# Patient Record
Sex: Male | Born: 1935 | Race: White | Hispanic: No | Marital: Married
Health system: Southern US, Community
[De-identification: ages and names within clinical notes are randomized; demographics above are authoritative.]

---

## 2005-06-12 ENCOUNTER — Ambulatory Visit: Payer: Self-pay | Admitting: Internal Medicine

## 2006-06-04 ENCOUNTER — Ambulatory Visit: Payer: Self-pay | Admitting: Cardiology

## 2012-07-31 ENCOUNTER — Other Ambulatory Visit: Payer: Self-pay | Admitting: Internal Medicine

## 2012-08-01 LAB — URINALYSIS, COMPLETE
Glucose,UR: NEGATIVE mg/dL (ref 0–75)
Ketone: NEGATIVE
Nitrite: NEGATIVE
Ph: 5 (ref 4.5–8.0)
Protein: 100
Specific Gravity: 1.016 (ref 1.003–1.030)
Squamous Epithelial: <1

## 2013-01-08 ENCOUNTER — Observation Stay: Payer: Self-pay | Admitting: Internal Medicine

## 2013-01-08 LAB — DIFFERENTIAL
Basophil %: 0.3 %
Eosinophil #: 0.1 10*3/uL (ref 0.0–0.7)
Lymphocyte #: 1.7 10*3/uL (ref 1.0–3.6)
Monocyte #: 2.9 x10 3/mm — ABNORMAL HIGH (ref 0.2–1.0)
Monocyte %: 7.5 %

## 2013-01-08 LAB — COMPREHENSIVE METABOLIC PANEL
Alkaline Phosphatase: 99 U/L (ref 50–136)
Bilirubin,Total: 1.4 mg/dL — ABNORMAL HIGH (ref 0.2–1.0)
Chloride: 108 mmol/L — ABNORMAL HIGH (ref 98–107)
EGFR (Non-African Amer.): >60
Glucose: 178 mg/dL — ABNORMAL HIGH (ref 65–99)
SGPT (ALT): 17 U/L (ref 12–78)

## 2013-01-08 LAB — URINALYSIS, COMPLETE
Nitrite: NEGATIVE
Ph: 5 (ref 4.5–8.0)
Protein: 100
RBC,UR: 3 /HPF (ref 0–5)
Squamous Epithelial: <1
WBC UR: 14 /HPF (ref 0–5)

## 2013-01-08 LAB — LIPASE, BLOOD: Lipase: 89 U/L (ref 73–393)

## 2013-01-08 LAB — CBC
HCT: 42.7 % (ref 40.0–52.0)
MCH: 28.6 pg (ref 26.0–34.0)
MCHC: 34.1 g/dL (ref 32.0–36.0)
RBC: 5.09 10*6/uL (ref 4.40–5.90)

## 2013-01-09 LAB — CBC WITH DIFFERENTIAL/PLATELET
Basophil #: 0 10*3/uL (ref 0.0–0.1)
Basophil %: 0.2 %
Eosinophil #: 0 10*3/uL (ref 0.0–0.7)
Eosinophil %: 0 %
HGB: 12 g/dL — ABNORMAL LOW (ref 13.0–18.0)
Lymphocyte #: 1.9 10*3/uL (ref 1.0–3.6)
Lymphocyte %: 6.1 %
MCHC: 33.2 g/dL (ref 32.0–36.0)
MCV: 84 fL (ref 80–100)
Neutrophil #: 26.7 10*3/uL — ABNORMAL HIGH (ref 1.4–6.5)
Neutrophil %: 85.8 %
Platelet: 221 10*3/uL (ref 150–440)
RDW: 14.9 % — ABNORMAL HIGH (ref 11.5–14.5)
WBC: 31.1 10*3/uL — ABNORMAL HIGH (ref 3.8–10.6)

## 2013-01-09 LAB — COMPREHENSIVE METABOLIC PANEL
Albumin: 2.6 g/dL — ABNORMAL LOW (ref 3.4–5.0)
Alkaline Phosphatase: 77 U/L (ref 50–136)
BUN: 27 mg/dL — ABNORMAL HIGH (ref 7–18)
Bilirubin,Total: 0.9 mg/dL (ref 0.2–1.0)
Calcium, Total: 8.6 mg/dL (ref 8.5–10.1)
Co2: 27 mmol/L (ref 21–32)
EGFR (African American): 55 — ABNORMAL LOW
EGFR (Non-African Amer.): 48 — ABNORMAL LOW
Glucose: 137 mg/dL — ABNORMAL HIGH (ref 65–99)
Osmolality: 296 (ref 275–301)
Potassium: 2.8 mmol/L — ABNORMAL LOW (ref 3.5–5.1)
SGOT(AST): 21 U/L (ref 15–37)
SGPT (ALT): 14 U/L (ref 12–78)
Sodium: 145 mmol/L (ref 136–145)
Total Protein: 6 g/dL — ABNORMAL LOW (ref 6.4–8.2)

## 2013-01-09 LAB — TROPONIN I: Troponin-I: 0.05 ng/mL

## 2013-01-10 LAB — BASIC METABOLIC PANEL
Calcium, Total: 7.8 mg/dL — ABNORMAL LOW (ref 8.5–10.1)
Chloride: 106 mmol/L (ref 98–107)
Co2: 28 mmol/L (ref 21–32)
EGFR (African American): 58 — ABNORMAL LOW
EGFR (Non-African Amer.): 50 — ABNORMAL LOW
Glucose: 122 mg/dL — ABNORMAL HIGH (ref 65–99)
Osmolality: 290 (ref 275–301)
Sodium: 142 mmol/L (ref 136–145)

## 2013-01-10 LAB — CBC WITH DIFFERENTIAL/PLATELET
Basophil #: 0.1 10*3/uL (ref 0.0–0.1)
Eosinophil #: 0 10*3/uL (ref 0.0–0.7)
HGB: 11.6 g/dL — ABNORMAL LOW (ref 13.0–18.0)
MCH: 28.9 pg (ref 26.0–34.0)
MCV: 84 fL (ref 80–100)
Monocyte #: 1.1 x10 3/mm — ABNORMAL HIGH (ref 0.2–1.0)
Monocyte %: 5.4 %
Neutrophil #: 17.6 10*3/uL — ABNORMAL HIGH (ref 1.4–6.5)
Neutrophil %: 86.5 %
Platelet: 159 10*3/uL (ref 150–440)
RBC: 4 10*6/uL — ABNORMAL LOW (ref 4.40–5.90)
WBC: 20.4 10*3/uL — ABNORMAL HIGH (ref 3.8–10.6)

## 2013-01-12 LAB — URINE CULTURE

## 2013-09-17 ENCOUNTER — Emergency Department: Payer: Self-pay | Admitting: Emergency Medicine

## 2014-02-10 ENCOUNTER — Emergency Department: Payer: Self-pay | Admitting: Emergency Medicine

## 2014-02-10 LAB — CBC
HCT: 38.7 % — ABNORMAL LOW (ref 40.0–52.0)
HGB: 12.3 g/dL — ABNORMAL LOW (ref 13.0–18.0)
MCH: 29 pg (ref 26.0–34.0)
MCHC: 31.8 g/dL — ABNORMAL LOW (ref 32.0–36.0)
MCV: 91 fL (ref 80–100)
Platelet: 172 10*3/uL (ref 150–440)
RBC: 4.24 10*6/uL — ABNORMAL LOW (ref 4.40–5.90)
RDW: 14.9 % — AB (ref 11.5–14.5)
WBC: 8.2 10*3/uL (ref 3.8–10.6)

## 2014-02-10 LAB — COMPREHENSIVE METABOLIC PANEL
ALT: 14 U/L (ref 12–78)
Albumin: 3 g/dL — ABNORMAL LOW (ref 3.4–5.0)
Alkaline Phosphatase: 100 U/L
Anion Gap: 4 — ABNORMAL LOW (ref 7–16)
BUN: 23 mg/dL — ABNORMAL HIGH (ref 7–18)
Bilirubin,Total: 0.3 mg/dL (ref 0.2–1.0)
CALCIUM: 8.9 mg/dL (ref 8.5–10.1)
Chloride: 109 mmol/L — ABNORMAL HIGH (ref 98–107)
Co2: 26 mmol/L (ref 21–32)
Creatinine: 1.45 mg/dL — ABNORMAL HIGH (ref 0.60–1.30)
EGFR (African American): 50 — ABNORMAL LOW
GFR CALC NON AF AMER: 43 — AB
Glucose: 92 mg/dL (ref 65–99)
Osmolality: 281 (ref 275–301)
Potassium: 4.3 mmol/L (ref 3.5–5.1)
SGOT(AST): 23 U/L (ref 15–37)
SODIUM: 139 mmol/L (ref 136–145)
TOTAL PROTEIN: 7.1 g/dL (ref 6.4–8.2)

## 2014-02-10 LAB — LIPASE, BLOOD: Lipase: 251 U/L (ref 73–393)

## 2014-03-15 ENCOUNTER — Other Ambulatory Visit: Payer: Self-pay

## 2014-03-15 LAB — URINALYSIS, COMPLETE
Bilirubin,UR: NEGATIVE
Glucose,UR: NEGATIVE mg/dL (ref 0–75)
KETONE: NEGATIVE
NITRITE: NEGATIVE
Ph: 7 (ref 4.5–8.0)
Protein: 100
RBC,UR: 87 /HPF (ref 0–5)
SPECIFIC GRAVITY: 1.011 (ref 1.003–1.030)
WBC UR: 504 /HPF (ref 0–5)

## 2014-03-18 LAB — URINE CULTURE

## 2014-04-23 ENCOUNTER — Inpatient Hospital Stay: Payer: Self-pay | Admitting: Internal Medicine

## 2014-04-23 LAB — URINALYSIS, COMPLETE
Bilirubin,UR: NEGATIVE
GLUCOSE, UR: NEGATIVE mg/dL (ref 0–75)
Ketone: NEGATIVE
Nitrite: NEGATIVE
Ph: 5 (ref 4.5–8.0)
Protein: 100
Specific Gravity: 1.014 (ref 1.003–1.030)
WBC UR: 5416 /HPF (ref 0–5)

## 2014-04-23 LAB — COMPREHENSIVE METABOLIC PANEL
ALBUMIN: 3.2 g/dL — AB (ref 3.4–5.0)
ALK PHOS: 102 U/L
ALT: 15 U/L
ANION GAP: 10 (ref 7–16)
AST: 16 U/L (ref 15–37)
BILIRUBIN TOTAL: 0.8 mg/dL (ref 0.2–1.0)
BUN: 34 mg/dL — ABNORMAL HIGH (ref 7–18)
CREATININE: 1.74 mg/dL — AB (ref 0.60–1.30)
Calcium, Total: 8.7 mg/dL (ref 8.5–10.1)
Chloride: 111 mmol/L — ABNORMAL HIGH (ref 98–107)
Co2: 21 mmol/L (ref 21–32)
Glucose: 146 mg/dL — ABNORMAL HIGH (ref 65–99)
OSMOLALITY: 293 (ref 275–301)
Potassium: 4 mmol/L (ref 3.5–5.1)
Sodium: 142 mmol/L (ref 136–145)
TOTAL PROTEIN: 7.3 g/dL (ref 6.4–8.2)

## 2014-04-23 LAB — CBC WITH DIFFERENTIAL/PLATELET
Basophil #: 0.1 10*3/uL (ref 0.0–0.1)
Basophil %: 0.4 %
EOS PCT: 0.1 %
Eosinophil #: 0 10*3/uL (ref 0.0–0.7)
HCT: 42.1 % (ref 40.0–52.0)
HGB: 13.1 g/dL (ref 13.0–18.0)
LYMPHS ABS: 2.3 10*3/uL (ref 1.0–3.6)
Lymphocyte %: 8.9 %
MCH: 28.3 pg (ref 26.0–34.0)
MCHC: 31.1 g/dL — ABNORMAL LOW (ref 32.0–36.0)
MCV: 91 fL (ref 80–100)
MONOS PCT: 9.6 %
Monocyte #: 2.5 x10 3/mm — ABNORMAL HIGH (ref 0.2–1.0)
NEUTROS ABS: 20.7 10*3/uL — AB (ref 1.4–6.5)
NEUTROS PCT: 81 %
Platelet: 225 10*3/uL (ref 150–440)
RBC: 4.62 10*6/uL (ref 4.40–5.90)
RDW: 16 % — AB (ref 11.5–14.5)
WBC: 25.5 10*3/uL — ABNORMAL HIGH (ref 3.8–10.6)

## 2014-04-23 LAB — PROTIME-INR
INR: 1.3
PROTHROMBIN TIME: 15.5 s — AB (ref 11.5–14.7)

## 2014-04-23 LAB — PHOSPHORUS: Phosphorus: 2.6 mg/dL (ref 2.5–4.9)

## 2014-04-23 LAB — TROPONIN I

## 2014-04-23 LAB — MAGNESIUM: MAGNESIUM: 1.6 mg/dL — AB

## 2014-04-23 LAB — LIPASE, BLOOD: Lipase: 111 U/L (ref 73–393)

## 2014-04-24 LAB — BASIC METABOLIC PANEL
ANION GAP: 7 (ref 7–16)
BUN: 32 mg/dL — ABNORMAL HIGH (ref 7–18)
CALCIUM: 8.7 mg/dL (ref 8.5–10.1)
Chloride: 116 mmol/L — ABNORMAL HIGH (ref 98–107)
Co2: 24 mmol/L (ref 21–32)
Creatinine: 1.8 mg/dL — ABNORMAL HIGH (ref 0.60–1.30)
GFR CALC AF AMER: 46 — AB
GFR CALC NON AF AMER: 38 — AB
Glucose: 122 mg/dL — ABNORMAL HIGH (ref 65–99)
OSMOLALITY: 301 (ref 275–301)
POTASSIUM: 4.1 mmol/L (ref 3.5–5.1)
Sodium: 147 mmol/L — ABNORMAL HIGH (ref 136–145)

## 2014-04-24 LAB — CBC WITH DIFFERENTIAL/PLATELET
Basophil #: 0 10*3/uL (ref 0.0–0.1)
Basophil %: 0.2 %
Eosinophil #: 0 10*3/uL (ref 0.0–0.7)
Eosinophil %: 0 %
HCT: 38.3 % — ABNORMAL LOW (ref 40.0–52.0)
HGB: 12.1 g/dL — ABNORMAL LOW (ref 13.0–18.0)
LYMPHS ABS: 1.9 10*3/uL (ref 1.0–3.6)
LYMPHS PCT: 7.3 %
MCH: 28.7 pg (ref 26.0–34.0)
MCHC: 31.6 g/dL — ABNORMAL LOW (ref 32.0–36.0)
MCV: 91 fL (ref 80–100)
MONO ABS: 2.3 x10 3/mm — AB (ref 0.2–1.0)
Monocyte %: 8.9 %
NEUTROS ABS: 22 10*3/uL — AB (ref 1.4–6.5)
Neutrophil %: 83.6 %
Platelet: 196 10*3/uL (ref 150–440)
RBC: 4.21 10*6/uL — ABNORMAL LOW (ref 4.40–5.90)
RDW: 16 % — ABNORMAL HIGH (ref 11.5–14.5)
WBC: 26.3 10*3/uL — AB (ref 3.8–10.6)

## 2014-04-25 DIAGNOSIS — I369 Nonrheumatic tricuspid valve disorder, unspecified: Secondary | ICD-10-CM

## 2014-04-25 LAB — CBC WITH DIFFERENTIAL/PLATELET
Basophil #: 0.1 10*3/uL (ref 0.0–0.1)
Basophil %: 0.6 %
EOS ABS: 0.1 10*3/uL (ref 0.0–0.7)
EOS PCT: 0.6 %
HCT: 30.2 % — AB (ref 40.0–52.0)
HGB: 9.7 g/dL — ABNORMAL LOW (ref 13.0–18.0)
LYMPHS ABS: 2.4 10*3/uL (ref 1.0–3.6)
LYMPHS PCT: 17.9 %
MCH: 29.1 pg (ref 26.0–34.0)
MCHC: 32.2 g/dL (ref 32.0–36.0)
MCV: 90 fL (ref 80–100)
Monocyte #: 0.8 x10 3/mm (ref 0.2–1.0)
Monocyte %: 6 %
NEUTROS PCT: 74.9 %
Neutrophil #: 9.9 10*3/uL — ABNORMAL HIGH (ref 1.4–6.5)
Platelet: 158 10*3/uL (ref 150–440)
RBC: 3.35 10*6/uL — AB (ref 4.40–5.90)
RDW: 15.8 % — ABNORMAL HIGH (ref 11.5–14.5)
WBC: 13.2 10*3/uL — ABNORMAL HIGH (ref 3.8–10.6)

## 2014-04-25 LAB — BASIC METABOLIC PANEL
ANION GAP: 8 (ref 7–16)
BUN: 25 mg/dL — AB (ref 7–18)
CO2: 22 mmol/L (ref 21–32)
Calcium, Total: 7.9 mg/dL — ABNORMAL LOW (ref 8.5–10.1)
Chloride: 115 mmol/L — ABNORMAL HIGH (ref 98–107)
Creatinine: 1.4 mg/dL — ABNORMAL HIGH (ref 0.60–1.30)
EGFR (African American): >60
EGFR (Non-African Amer.): 51 — ABNORMAL LOW
Glucose: 90 mg/dL (ref 65–99)
OSMOLALITY: 293 (ref 275–301)
Potassium: 3.1 mmol/L — ABNORMAL LOW (ref 3.5–5.1)
Sodium: 145 mmol/L (ref 136–145)

## 2014-04-26 LAB — CBC WITH DIFFERENTIAL/PLATELET
BASOS PCT: 0.5 %
Basophil #: 0 10*3/uL (ref 0.0–0.1)
EOS ABS: 0.2 10*3/uL (ref 0.0–0.7)
Eosinophil %: 1.8 %
HCT: 31 % — AB (ref 40.0–52.0)
HGB: 10.2 g/dL — ABNORMAL LOW (ref 13.0–18.0)
LYMPHS ABS: 2.1 10*3/uL (ref 1.0–3.6)
Lymphocyte %: 24.8 %
MCH: 29.4 pg (ref 26.0–34.0)
MCHC: 33 g/dL (ref 32.0–36.0)
MCV: 89 fL (ref 80–100)
MONO ABS: 0.7 x10 3/mm (ref 0.2–1.0)
MONOS PCT: 7.9 %
NEUTROS PCT: 65 %
Neutrophil #: 5.5 10*3/uL (ref 1.4–6.5)
Platelet: 160 10*3/uL (ref 150–440)
RBC: 3.47 10*6/uL — ABNORMAL LOW (ref 4.40–5.90)
RDW: 16.1 % — ABNORMAL HIGH (ref 11.5–14.5)
WBC: 8.5 10*3/uL (ref 3.8–10.6)

## 2014-04-26 LAB — BASIC METABOLIC PANEL
Anion Gap: 8 (ref 7–16)
BUN: 18 mg/dL (ref 7–18)
Calcium, Total: 7.6 mg/dL — ABNORMAL LOW (ref 8.5–10.1)
Chloride: 113 mmol/L — ABNORMAL HIGH (ref 98–107)
Co2: 24 mmol/L (ref 21–32)
Creatinine: 1.19 mg/dL (ref 0.60–1.30)
EGFR (African American): >60
GLUCOSE: 95 mg/dL (ref 65–99)
Osmolality: 290 (ref 275–301)
Potassium: 2.9 mmol/L — ABNORMAL LOW (ref 3.5–5.1)
SODIUM: 145 mmol/L (ref 136–145)

## 2014-04-26 LAB — URINE CULTURE

## 2014-04-26 LAB — CULTURE, BLOOD (SINGLE)

## 2014-04-28 LAB — CULTURE, BLOOD (SINGLE)

## 2014-04-29 LAB — CULTURE, BLOOD (SINGLE)

## 2014-11-17 NOTE — H&P (Signed)
PATIENT NAME:  Joel Hanson, Joel Hanson MR#:  [id number] DATE OF BIRTH:  August 18, 1924  DATE OF ADMISSION:  01/08/2013  EMERGENCY DEPARTMENT REFERRING PHYSICIAN: Humberto LeepJohn H. Margarita GrizzleWoodruff, MD  PRIMARY CARE PHYSICIAN: Jillene BucksDenny C. Arlana Pouchate, MD  CHIEF COMPLAINT: Intractable nausea and vomiting for the past 24 hours.   HISTORY OF PRESENT ILLNESS: This is an 81 year old Caucasian male with past medical history of hypertension, gout, diabetes, dementia, constipation history of prostate cancer status post prostatectomy, sick sinus syndrome status post pacemaker, admitted with above chief complaint. History is per the daughter, which is very limited. Daughter states that over the last 24 hours, her father has been having some nausea and vomiting that is progressively getting worse. She said that he has not had a bowel movement in over 4 or 5 days and when he did have one this morning, he did have a very small, very hard bowel movement and it was accompanied with dark stools. His vomiting this morning started to be more pronounced and he started to have  dark coffee-ground vomitus per the daughter. He is a hospice patient. Hospice care is limited to administering his medications and a caretaker. Contact with the hospice home indicated that he is a full code and wants full medical management for any medical issues that he may have. Daughter denies any fevers at home. Recent sick contacts include his wife, who was recently diagnosed with MRSA bacteremia. She is currently improving and is at [facility] nursing [facility]. Hospitalist services were consulted after his CT scan of his abdomen showed that he had a large amount of impacted stool in the colon and also on the KUB. He continued to have some vomiting throughout his time in the ED. Per daughter and nursing, he is actually improving at the time of my evaluation. He is able to take some p.o. right now.   PAST MEDICAL HISTORY: As stated above.   PAST SURGICAL HISTORY: Prostatectomy.   HOME  MEDICATIONS: Allopurinol 300 mg 1 tab p.o. daily,  atorvastatin 20 mg 1 tab at bedtime, docusate sodium 100 mg 1 tab once a day, meloxicam 7.5 mg 1 tab daily, metformin 500 mg 1 tab b.i.d., quetiapine 25 mg 1 tab orally at bedtime, Silvadene 1% topical apply topically to the area 4 times a day as needed, tramadol 50 mg 1 tab every 6 hours as needed.   ALLERGIES: No known drug allergies.   FAMILY HISTORY: Not significant.   SOCIAL HISTORY: Ex-smoker, quit 40 years ago, nondrinker.   REVIEW OF SYSTEMS:  The patient is demented and cannot give a full review of systems.   PHYSICAL EXAMINATION:   VITAL SIGNS: In the ED are as follows: Temperature 97.3, pulse 97, respirations 16, blood pressure 190/87. These vitals are from 11:00 a.Hanson. Pulse ox at 93%.  GENERAL APPEARANCE: Well-developed, well-nourished male in no acute respiratory distress, lying in bed.  HEENT: PERRLA, EOMI. No scleral icterus or conjunctivitis. No difficulty hearing. TMs are intact. Mucous membranes are dry. Dentition is poor.  NECK: No thyroid enlargement or nodules. Neck is supple and nontender.  RESPIRATORY: Clear to auscultation bilaterally. No rales, rhonchi, crackles or diminished breath sounds. No labored breathing is noted.  CARDIOVASCULAR: Regular rate, regular rhythm. No murmurs. S1 and S2 auscultated. No lower extremity edema. Chest is nontender. Good pedal pulses noted.  ABDOMEN: Mildly distended, tender in the epigastric region to deep palpation. There is some firmness noted in the left lower quadrant.  MUSCULOSKELETAL: 4/5 strength bilateral upper and lower extremities.  SKIN:  No rash, lesions or erythema. Skin is warm and dry.  LYMPHATIC: No adenopathy noted in cervical, axilla or supraclavicular regions.  NEUROLOGIC: Cranial nerves II through XII intact. Deep tendon reflexes intact.  PSYCHIATRIC: Disoriented, demented. Poor judgment, however, is cooperative. Memory is impaired.   LABORATORY, DIAGNOSTIC AND  RADIOLOGICAL DATA: His chemistry shows a sodium of 141, potassium 4.7, chloride 108, bicarbonate 23, BUN 18, creatinine 0.86, glucose at 178. His lipase was 89. AST/ALT was 37/17, total bilirubin 1.4. Troponin 0.04. He did have imaging done of the abdomen. He had a KUB that showed findings most consistent with severe constipation. Several nonspecific mildly dilated loops of small bowel are noted. Surgical clips are noted. Severe degenerative changes and scoliosis of the lumbar spine. Large amount of stool is noted throughout the colon consistent with constipation. He also had a CT of the abdomen and pelvis that showed mild peripancreatic edema. Pancreatitis could present in this fashion. There is mild ascites. Gallstones. No biliary ductal distention. Large amount of stool is noted in the colon. No significant bowel distention. His EKG shows sinus rhythm, no acute ST-T wave changes.   ASSESSMENT AND PLAN: This is an 81 year old male with past medical history significant for sick sinus syndrome, prostate cancer status post prostatectomy, gout, dementia and diabetes, presenting with severe constipation with nausea and vomiting.  1.  Nausea and vomiting, intractable in nature, secondary to severe constipation: Etiology of the constipation could be dehydration; however, this is not completely known at this time. He did have some dark vomitus per daughter. We will follow with hemoglobin and hematocrit. He had a  bowel movement with Fleet Enema and a small amount of GoLYTELY while in the Emergency Department. Zofran was given for his nausea and he was able to tolerate a small amount of GoLYTELY. Will give him another round of GoLYTELY and a Fleet Enema. If he is still having nausea and vomiting, we will try decompression with nasogastric tube placement. Start him on intravenous fluids, 80 mL/hour of normal saline. Will give him intravenous morphine for pain, 1 mg every 6 hours as needed, and then transition him back to  oral once he is able to tolerate oral medications. We will monitor his electrolytes. Start him on intravenous proton pump inhibitor. If no improvement over the next 24 hours, will then consult gastroenterology. CT of the abdomen with findings of large amount of stool in the large colon and peripancreatic edema. Again, will recheck lipase in the morning.  2.  Severe constipation, etiology unknown, possibly due to dehydration: Management as stated above.  3.  Leukocytosis secondary to severe stress with severe constipation: Afebrile currently. If he continues to spike fevers and develops a systemic inflammatory response syndrome or sepsis-like picture, will start him on empiric antibiotics.  4.  Hypertension: Currently cannot tolerate full oral at this time, especially pills. Once he becomes more tolerant of oral pills, will start him on his home medications. For the time being, will start him on intravenous Lopressor 10 mg every 6 hours as needed for elevated blood pressure.  5.  Gout: No acute flare is noted at this time. Monitor closely.  6.  Diabetes: Will start him on insulin sliding scale.  7.  Social: The patient has hospice care for medications and caretaker. Speaking with the hospice nurse, he is a full code and family would want at this time any measures needed that are lifesaving.   TIME SPENT DICTATING AND EVALUATING PATIENT: 50 minutes.  ____________________________ Stephanie Acre, MD vm:jm D: 01/08/2013 17:07:07 ET T: 01/08/2013 19:28:04 ET JOB#: [id number]  cc: Stephanie Acre, MD, <Dictator> Stephanie Acre MD ELECTRONICALLY SIGNED 01/08/2013 23:25

## 2014-11-17 NOTE — H&P (Signed)
Past Med/Surgical Hx:  CAD:   Hypertension:   Diabetes:   ALLERGIES:  No Known Allergies:    Assessment/Admission Diagnosis 81 yo male with PMHx of HTN, Sick Sinus Syndrome s\p pacemaker, Dementia, Gout, DM, CAD, presented with intractable vomiting and constipation  1. Intractable Vomiting - secondary to severe constipation - had dark vomitius per daughter, will follow H\H - had a BM with fleet enemas - will plan for fleet enemas BID, give golytely today - NGT placement for decompression (low to wall intermittent suction if unable to tolerate Golytely via the tube) - IVF 80cc\hr of NS, IV morphine for pain (transition to PO pain control once able to tolerate PO) - monitor lytes, PPI,  - if no improvement over the next 24 hrs then will Consult GI - CT A\P with findings of large amount of stools in the large colon and mild peripancreatic edema - recheck lipase in the AM.  2. Severe constipation - etiology unknown, ? dehydration - management as above  3. Leukocytosis - secondary to severe stress - afebrile currently - monitor temp and check CBC, if spiking fever will plan for empiric antibiotics  4. HTN - IV lopressor PRN, until able to take po, then restart meds  5 GOUT - no acute flare currently - monitor closely  6. DM - ssi   Social - patient has hospice care for meds and caretaker, he is a FULL CODE.  FULL CODE PMD - Dr. Arlana Pouchate  GI/DVT prophylaxis - PPI/SCDs  Time spent evaluating patient = 50 minutes [id number]#[id number]   Electronic Signatures: Stephanie AcreMungal, Aubry Tucholski (MD)  (Signed 14-Jun-14 16:57)  Authored: PAST MEDICAL/SURGIAL HISTORY, ALLERGIES, HOME MEDICATIONS, ASSESSMENT AND PLAN   Last Updated: 14-Jun-14 16:57 by Stephanie AcreMungal, Nuno Brubacher (MD)

## 2014-11-17 NOTE — Discharge Summary (Signed)
PATIENT NAME:  Joel Hanson, Joel Hanson MR#:  [id number] DATE OF BIRTH:  [birth year]  DATE OF ADMISSION:  01/08/2013 DATE OF DISCHARGE:  01/11/2013     ADMISSION DIAGNOSIS: Severe constipation.  DISCHARGE DIAGNOSES:   1.  Severe constipation with probable ileus.  2.  Acute renal failure.  3.  Hypokalemia.  4.  Urinary tract infection. 5.  Leukocytosis.   CONSULTS: None.   LABORATORY AND RADIOLOGICAL DATA AT DISCHARGE: White blood cells 20, hemoglobin 11.6, hematocrit 34, platelets 159. Sodium 142, potassium 3.5, chloride 106, bicarbonate 28, BUN 29, creatinine 1.28, glucose 128.   CT of the abdomen and pelvis on admission showed severe constipation.   HOSPITAL COURSE: This is an 81 year old male who was brought in for abdominal pain, nausea and vomiting, was found to have severe constipation on CT as well as x-ray. For further details, please refer to HPI.  1.  Severe constipation: The patient likely had an ileus as well on admission. He had an NG tube placed, and this was actually discontinued as the patient was doing better. His belly was less distended. He had several bowel movements here. He is tolerating his diet. We do recommend laxatives at discharge.  2.  Acute renal failure from dehydration and constipation, which improved with IV fluids and bowel regimen treatment.  3.  Hypokalemia, which improved.  4.  Urinary tract infection: The patient was on Rocephin here. There were no blood or urine cultures taken. He was afebrile throughout hospitalization. 5.  Leukocytosis: Initially thought to be stress from constipation and ileus, but also likely UTI. His white count was 37 on admission and now is 20, which is trending down. He has been afebrile.   DISCHARGE MEDICATIONS: 1.  Metformin 500 b.i.d.  2.  Quetiapine 25 mg at bedtime.  3.  Allopurinol 300 mg daily.  4.  Atorvastatin 20 mg daily.  5.  Norvasc 5 mg daily.  6.  Tramadol 50 mg q.6 hours p.r.n.  7.  Silvadene 1% topically 4 times a  day as needed.  8.  Docusate 100 mg daily.  9.  Lactulose 30 mL b.i.d.  10.  Keflex 500 t.i.d. for 8 days.   DISCHARGE DIET: ADA diet.   DISCHARGE ACTIVITY: As tolerated.   DISCHARGE CARE: The patient will resume hospice home.   TIME SPENT: Approximately 35 minutes. The patient is medically stable for discharge.  ____________________________ Raeqwon Lux P. Juliene PinaMody, MD spm:jm D: 01/10/2013 14:36:14 ET T: 01/10/2013 17:44:16 ET JOB#: [id number]  cc: Ceria Suminski P. Juliene PinaMody, MD, <Dictator> Janyth ContesSITAL P Sharrod Achille MD ELECTRONICALLY SIGNED 01/11/2013 15:14

## 2014-11-18 NOTE — H&P (Signed)
PATIENT NAME:  Joel Hanson, Joel Hanson MR#:  [id number] DATE OF BIRTH:  06-05-1925  DATE OF ADMISSION:  04/23/2014  REFERRING PHYSICIAN: Eartha Inch. York Cerise, MD  PRIMARY CARE PHYSICIAN: Jillene Bucks. Arlana Pouch, MD  CHIEF COMPLAINT: Altered mental status.   HISTORY OF PRESENT ILLNESS: An 81 year old Caucasian gentleman with history of dementia, coronary artery disease and stroke, hypertension, type 2 diabetes non-insulin-requiring, presenting with altered mental status. The patient himself not able to provide any meaningful information given mental status and medical condition. History obtained from family who are at bedside. They describe the patient as being in his usual health about 24 hours ago; however, today more lethargic and noted him to have a fever of 101 degrees Fahrenheit at home. He was evaluated by his home health care nurses, who recommended presentation to the hospital and thus he was brought to the hospital for further workup and evaluation. Family does attest to him complaining of some abdominal pain, however, they are unable to quantify or provide any further information about this. Once again, the patient himself unable to provide any meaningful information given mental status and medical condition.   PAST MEDICAL HISTORY: Paroxysmal atrial fibrillation, coronary artery disease, history of stroke, hypertension, type 2 diabetes non-insulin-requiring uncomplicated, as well as history of permanent pacemaker insertion.   SOCIAL HISTORY: Remote tobacco abuse. No alcohol or drug use. He is essentially bedridden on a daily basis. He is demented; however, he is usually aware of his family members around him and able to converse, however, somewhat confused.   FAMILY HISTORY: No known cardiovascular or pulmonary disorders.   ALLERGIES: No known drug allergies.   HOME MEDICATIONS: Include meloxicam 7.5 mg p.o. daily, tramadol 50 mg p.o. q. 6 hours as needed for pain, clonazepam 0.5 mg p.o. b.i.d., allopurinol  300 mg p.o. daily, quetiapine 25 mg p.o. at bedtime, Norvasc 5 mg p.o. daily, Silvadene 1% topical cream to affected area 4 times daily as needed, Colace 100 mg p.o. daily, lactulose 30 mL p.o. b.i.d.   PHYSICAL EXAMINATION:  VITAL SIGNS: Temperature 102.9 degrees Fahrenheit, heart rate 70, respirations 20, blood pressure 182/91, saturating 94% on supplemental O2. Weight 81.6 kg. GENERAL: Chronically ill and weak-appearing Caucasian gentleman, currently in minimal to moderate distress given mental status.  HEAD: Normocephalic, atraumatic.  EYES: Pupils equal, round, reactive to light. Extraocular muscles intact. No scleral icterus. MOUTH: Dry mucosal membrane. Dentition poor. No abscess noted.  EARS, NOSE, THROAT: Clear without exudates. No external lesions.  NECK: Supple. No thyromegaly. No nodules. No JVD.  PULMONARY: Diminished breath sounds throughout all lung fields secondary to respiratory effort; however, no wheezes, rales, rhonchi. Poor respiratory effort, however, chest nontender to palpation. CARDIOVASCULAR: S1, S2, regular rate and rhythm. No murmurs, rubs, or gallops. No edema. Pedal pulses 2+ bilaterally.  GASTROINTESTINAL: Soft, nontender, nondistended. No masses. Positive bowel sounds. No hepatosplenomegaly.  MUSCULOSKELETAL: No swelling, clubbing. He does have 1+ sacral edema. Passive range of motion full in all extremities. He has some atrophy of the lower extremities secondary to nonuse.  NEUROLOGIC: Unable to fully assess given the patient's mental status and medical condition. He will moan incoherently; however, he is unable to follow any commands at this time.  SKIN: No ulceration, lesions, rashes or cyanosis. Skin warm, dry. Turgor intact.  PSYCHIATRIC: Unable to fully assess given the patient's mental status and medical condition. He is able to moan incoherently; however, he is not able to respond to any commands.   LABORATORY DATA: Chest x-ray: No acute cardiopulmonary  process. Sodium 142, potassium 4, chloride of 111, bicarbonate 21, BUN 34, creatinine 1.74, glucose 146.Marland Kitchen. LFTs: Albumin 3.2, otherwise within normal limits. WBC 25.5, hemoglobin 13.1, platelets of 225,000. Urinalysis: WBCs 5416, RBCs 379, leukocyte esterase 3+, nitrite negative, epithelial cells 6, lactic acid venous 2.2.   ASSESSMENT AND PLAN: An 81 year old Caucasian gentleman with a history of dementia, coronary artery disease who is presenting with altered mental status.  1.  Urosepsis secondary to urinary tract infection, meeting septic criteria by temperature and leukocytosis and meeting severe sepsis by elevated venous lactic acid receiving IV fluid hydration 20 mL/kg bolus. Continue IV fluid hydration to keep mean arterial pressure greater than 65. Repeat lactic acid 4 hours from initial. Panculture including blood and urine. Antibiotic coverage with ceftriaxone for now, as urinary source is the most likely etiology. Follow culture data and adjust antibiotics if required.  2.  Acute kidney injury, suspect prerenal etiology. IV fluid hydration as above. Follow renal function and urine output.  3.  Type 2 diabetes non-insulin-requiring, uncomplicated. Hold p.o. agents. Add insulin sliding scale with q. 6 hour Accu-Cheks.  4.  Hypertension. Continue with Norvasc.  5.  Venous thromboembolism prophylaxis with heparin subcutaneous.   CODE STATUS: The patient is do not resuscitate, as confirmed by family at bedside.   TIME SPENT: Forty-five minutes.    ____________________________ Cletis Athensavid K. Luv Mish, MD dkh:TT D: 04/23/2014 22:26:03 ET T: 04/23/2014 22:38:23 ET JOB#: [id number]  cc: Cletis Athensavid K. Kyzer Blowe, MD, <Dictator> Deaundre Allston Synetta ShadowK Egan Berkheimer MD ELECTRONICALLY SIGNED 04/23/2014 23:40

## 2014-11-18 NOTE — Discharge Summary (Signed)
PATIENT NAME:  Joel Hanson, Joel Hanson MR#:  [id number] DATE OF BIRTH:  01/09/1925  DATE OF ADMISSION:  04/23/2014 DATE OF DISCHARGE:  04/27/2104  ADMISSION DIAGNOSIS:  1.  Altered mental status with metabolic encephalopathy.  2.  Sepsis.   DISCHARGE DIAGNOSES:  1.  Altered mental status with metabolic encephalopathy.  2.  Sepsis due to methicillin-resistant Staphylococcus aureus and Escherichia coli urinary tract infection.  3.  Urinary tract infection with Escherichia coli and methicillin-resistant Staphylococcus aureus.   4.  Hypertension.  5.  Dementia.  6.  Acute renal failure, improved.  7.  Hypokalemia.   CONSULTATIONS:  Dr. Sampson Goon.   LABORATORY DATA:   1.  Initial blood cultures were positive for MRSA.   2.  Urine culture was positive for MRSA and Escherichia coli.  3.  Repeat a blood cultures from the 29th are negative.  4.  A 2-D echocardiogram showed ejection fraction of 45-50% and no evidence of thrombi.  5.  White blood cells 8.5, hemoglobin 10.2, hematocrit 31, platelets are 160,000. Sodium 145, potassium 2.9, chloride 113, bicarbonate 24, BUN 18, creatinine 1.19, glucose is 95.   HOSPITAL COURSE: This is an 81 year old male who is bedbound, history of dementia, CAD, who presented with altered mental status. For further details please refer to the H and P.   1.  Altered mental status secondary to metabolic encephalopathy, secondary to sepsis and UTI and bacteremia. The patient is now back at his baseline as far as his mental status is concerned.   2.  Sepsis due to MRSA bacteremia and Escherichia coli UTI and MRSA.  The patient presented with fever, leukocytosis, abnormal urinalysis, urine culture was positive for Escherichia coli and MRSA.  Blood culture was positive for MRSA.  We appreciate ID consultation. He was initially placed on IV ceftriaxone and vancomycin. The plan was for the patient to receive 2 weeks of IV VANCOMYCIN. However, patient pulled his PICC line out and a  second attempt was unsuccessful. Dr Sampson Goon was able to speak with Mr. Roig' son and they decided that 2 weeks of BACTRIM would be the alternative. He will be treated with BACTRIM through 10/12 with follow up with Drr. Fotzgerald after that to assure adequate treatment.. His echocardiogram did not show evidence of vegetations, I suspect his MRSA bacteremia was from his urinary tract infection.   3.  Urinary tract infection with Escherichia coli and MRSA.  Plan as outlined above.  4.  Hypertension. The patient was continued on Norvasc.  5.  Dementia which is stable. He is now at his baseline.  6.  Acute renal failure secondary to UTI and dehydration which improved with IV fluids. 7.  Hypokalemia. The patient's potassium was repleted prior to discharge.  DISCHARGE MEDICATIONS:  1.  Seroquel 25 mg at bedtime.  2.  Allopurinol 300 mg daily.  4.  Norvasc 5 mg daily.  5.  Tramadol 50 mg q. 6 hours p.r.n.  6.  Silvadene topical 4 times a day p.r.n.  7. Docusate 100 mg daily.  8.  Lactulose 30 mL b.i.d.  9.  Clonazepam 0.5 b.i.d.   10.  Bactrim 1 tablet p.o. b.i.d. x 6 days, stop on 05/08/2014.    DISCHARGE INSTRUCTIONS:  Discharged with home health,  DISCHARGE OXYGEN: 2 liters nasal cannula.   DISCHARGE DIET: Regular diet.   FOLLOWUP: Patient will follow up in 1 week with Dr. Sampson Goon.   TIME SPENT:  Approximately 38 minutes.    The patient was stable for discharge.  ____________________________ Janyth ContesSital P. Juliene PinaMody, MD spm:bu D: 04/27/2014 11:58:16 ET T: 04/27/2014 13:44:51 ET JOB#: [id number]  cc: Shalva Rozycki P. Juliene PinaMody, MD, <Dictator> Stann Mainlandavid P. Sampson GoonFitzgerald, MD Patricia PesaSITAL P Caedyn Tassinari MD ELECTRONICALLY SIGNED 04/27/2014 21:41

## 2014-11-18 NOTE — Consult Note (Signed)
PATIENT NAME:  Joel Hanson, Joel M MR#:  [id number] DATE OF BIRTH:  [birth year]  DATE OF CONSULTATION:  04/25/2014  REQUESTING PHYSICIAN:  Rolly PancakeVivek J. Cherlynn KaiserSainani, MD CONSULTING PHYSICIAN:  Stann Mainlandavid P. Sampson GoonFitzgerald, MD  REASON FOR CONSULTATION:  Staphylococcus aureus bacteremia and urinary tract infection.   HISTORY OF PRESENT ILLNESS: This is a pleasant 81 year old gentleman with history of dementia, as well as coronary disease, CVA, hypertension, and diabetes. He was admitted September 27 with fevers and altered mental status. He had a recent urinary tract infection, and had in and out catheter done by hospice and was treated with antibiotic which family members do not remember the name of it. He seemed to improve with a 10 day course of this, but they did note some continued malodorous urine. The patient has not had any skin lesions or sores. He has not had any other interventions besides the in and out catheterization for his urine sample collection approximately 2-3 weeks ago. Since admission his cultures are positive in 1 out of 2 for Staphylococcus aureus and urine culture is positive for Staphylococcus aureus and also for gram-negative rods. Clinically, he is defervescing and improving and is more alert.   PAST MEDICAL HISTORY: Dementia, coronary artery disease, stroke, hypertension, type 2 diabetes, bradycardia.   PAST SURGICAL HISTORY: Placement of a permanent pacemaker.   SOCIAL HISTORY: He lives at home and has a caregiver. His family is involved in his care. He is mainly bedridden. He does not smoke or drink.   FAMILY HISTORY: Noncontributory.   ALLERGIES: No known drug allergies.   REVIEW OF SYSTEMS: Eleven systems reviewed and negative except as per HPI.   ANTIBIOTICS SINCE ADMISSION: Include ceftriaxone begun September 27 and vancomycin begun September 27.   CURRENT ANTIBIOTICS AND ORDERS: Include ceftriaxone and vancomycin.   PHYSICAL EXAMINATION:  VITAL SIGNS:  Temperature 98.4, pulse 68,  blood pressure 128/79, respiration 18, saturation 96% on 2 liters.  GENERAL: He is frail, lying in bed in no acute distress.  HEENT: Pupils equal, round, and reactive to light and accommodation. Extraocular movements are intact. Sclerae anicteric.  Oropharynx is clear.  His mucous membranes are dry.  NECK: Supple.  HEART: Regular. He has a permanent pacemaker left upper chest which shows no tenderness or redness.  LUNGS: Clear.  ABDOMEN: Soft, nontender, nondistended.  EXTREMITIES: No clubbing, cyanosis, or edema.  SKIN: No peripheral stigmata of endocarditis or sores or bedsores.  LABORATORY DATA: Microbiology, urine culture September 27 greater than 100,000 gram-negative rods and greater than 100,000 Staphylococcus aureus. Blood culture September 27 one of two with Staphylococcus aureus. Followup blood culture September 28, no growth to date. White blood count on admission showed 25,000 white cells. Currently, it is down to 13.2, hemoglobin 9.7, platelets 158,000. INR is 1.3. Urinalysis on admission of 5416 whites, lactic acid was 2.2. Renal function on admission showed a BUN of 34, creatinine 1.74. Currently it is 25/1.4. LFTs were normal except albumin low at 3.2. Troponins were negative x 1.   IMAGING: Chest x-ray showed no acute cardiopulmonary disease. Echocardiogram is pending.   IMPRESSION: An 81 year old gentleman with dementia, largely bedbound, recurrent urinary tract infections, who was admitted with altered mental status, fever, leukocytosis, and sepsis and found to have urine infection with both Staphylococcus aureus and gram-negative rods. Sensitivities are pending. He also had 1 of 2 blood cultures with Staphylococcus aureus.   I suspect he developed a Staphylococcus aureus and gram-negative rod bacteruria and urinary infection potentially related to the recent in  and out catheter. Staphylococcus aureus does not usually cause urinary infections unless there have been some  instrumentation of the urinary system. He then became bacteremic from this. Clinically, he is much improved. Echocardiogram is pending and repeat blood cultures from the 28th are no growth to date.   RECOMMENDATIONS:  1.  Pending echocardiogram results, he will likely need at least a 2 week course of IV antibiotics.  2.  If his blood cultures are negative for 48 hours I would recommend placing a PICC line potentially tomorrow and then discharging on IV vancomycin with a goal trough of 15-20.  3.  The duration would again be 2 weeks from his first negative blood culture.  4.  I would tailor the antibiotic selection for the gram-negative rod based on further sensitivities and would give at least a 10 day course.   Thank you for the consult. I will be glad to follow with you.    ____________________________ Stann Mainland. Sampson Goon, MD dpf:LT D: 04/25/2014 17:31:00 ET T: 04/25/2014 20:17:59 ET JOB#: [id number]  cc: Stann Mainland. Sampson Goon, MD, <Dictator> DAVID Sampson Goon MD ELECTRONICALLY SIGNED 04/30/2014 23:54

## 2015-04-26 IMAGING — CT CT HEAD WITHOUT CONTRAST
3 series · 17 of 30 positions shown, 19 images · non-contrast
Comparison: None.

CLINICAL DATA: Fell at home and struck his head on a table.

EXAM:
CT HEAD WITHOUT CONTRAST
TECHNIQUE: Contiguous axial images were obtained from the base of the skull
through the vertex without intravenous contrast.

[Series 2: head wo · axial · 0.44mm/px · z∈[-60,+50]mm · 5 of 34 slices shown, 7 images]
[im 6/34  brain]
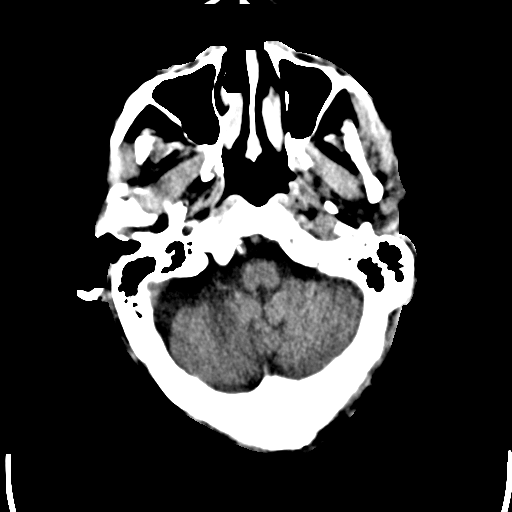
[im 6/34  bone]
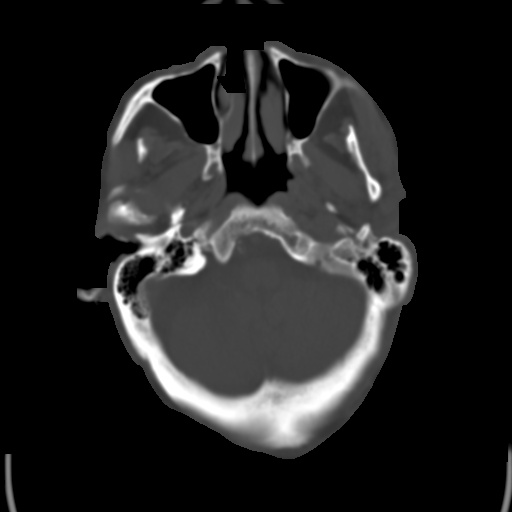
[im 12/34  brain]
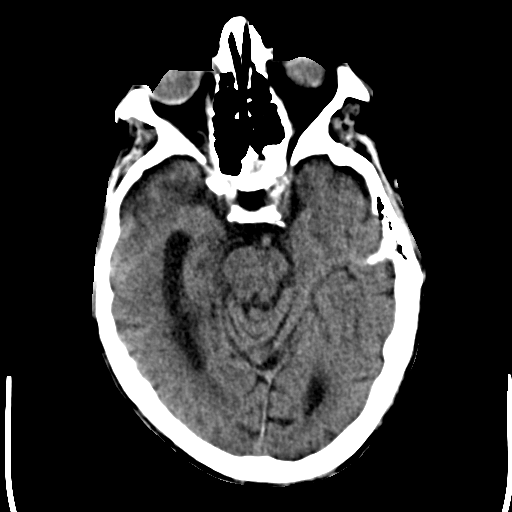
[im 17/34  brain]
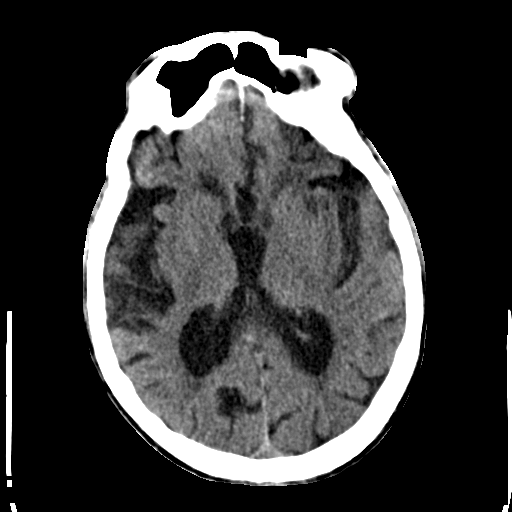
[im 23/34  brain]
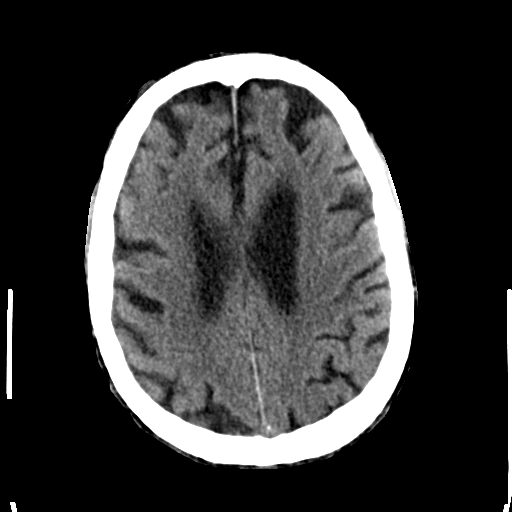
[im 28/34  brain]
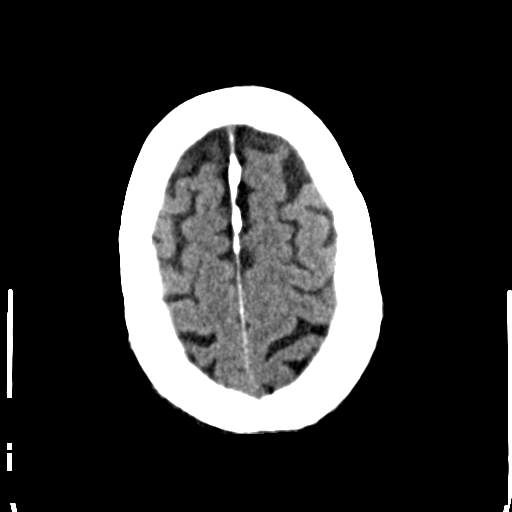
[im 28/34  bone]
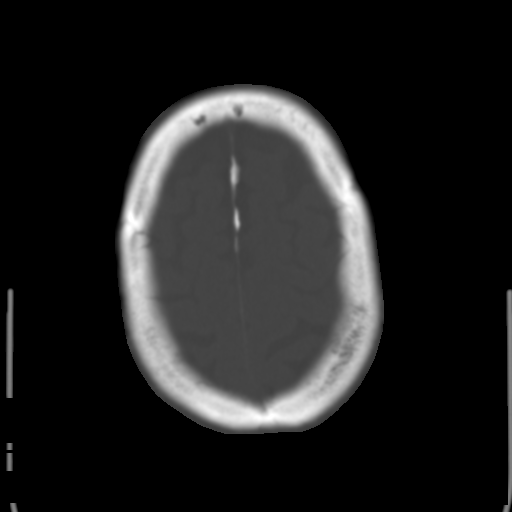

[Series 4: head wo recon · axial · 0.44mm/px · z∈[-42,+64]mm · 5 of 34 slices shown]
[im 6/34  brain]
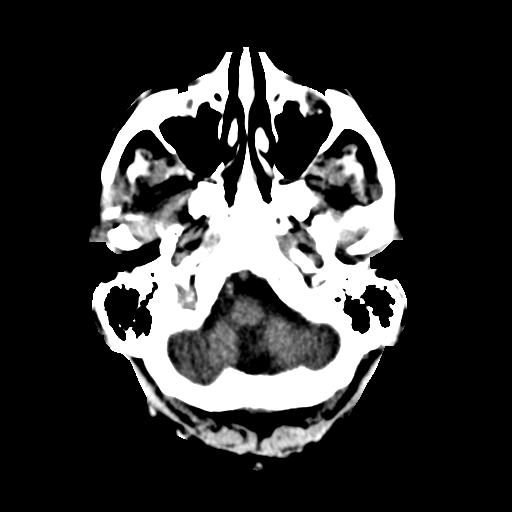
[im 12/34  brain]
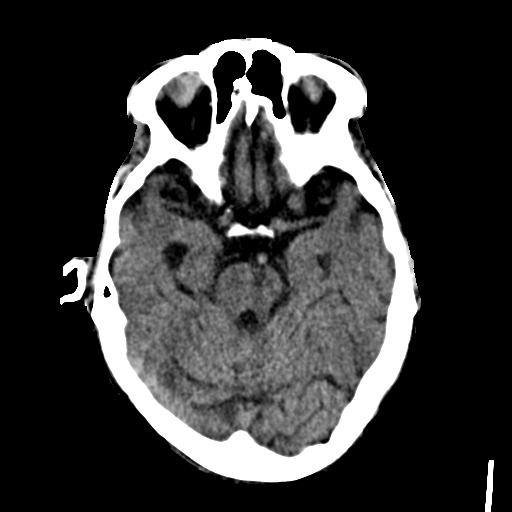
[im 17/34  brain]
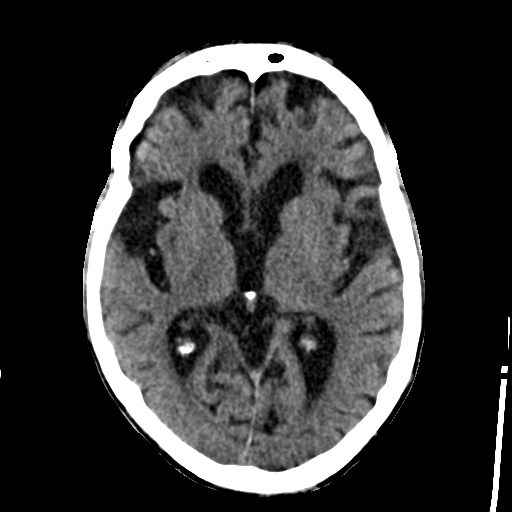
[im 23/34  brain]
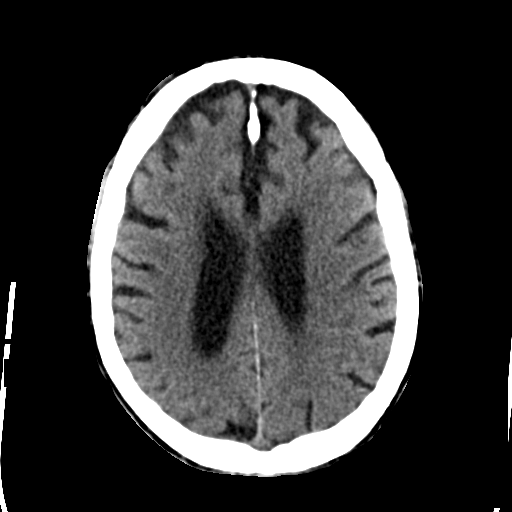
[im 28/34  brain]
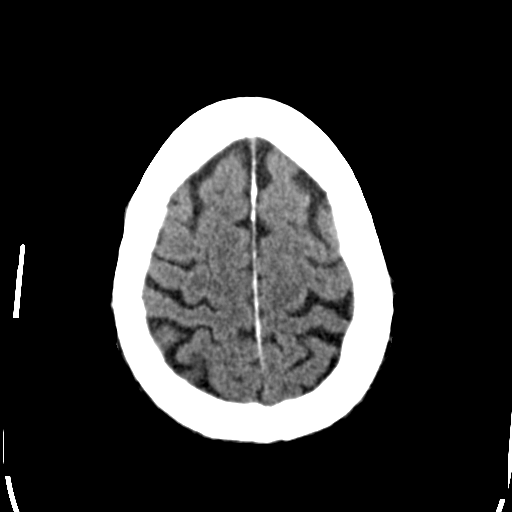

[Series 5: head bone recon · axial · 0.44mm/px · z∈[-54,+61]mm · 7 of 83 slices shown]
[im 6/83  bone]
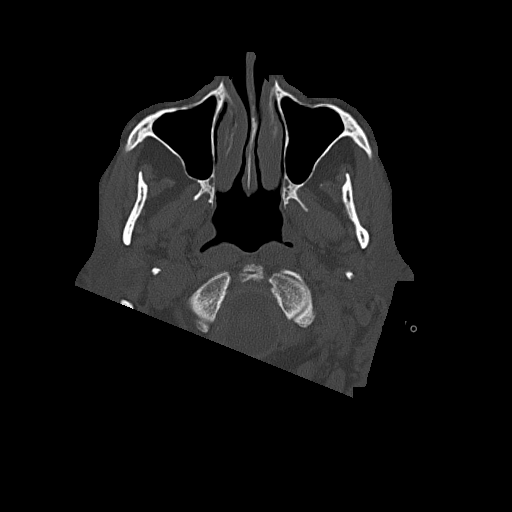
[im 17/83  bone]
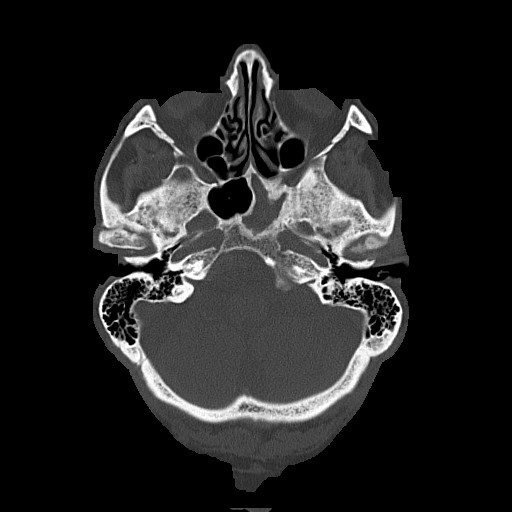
[im 28/83  bone]
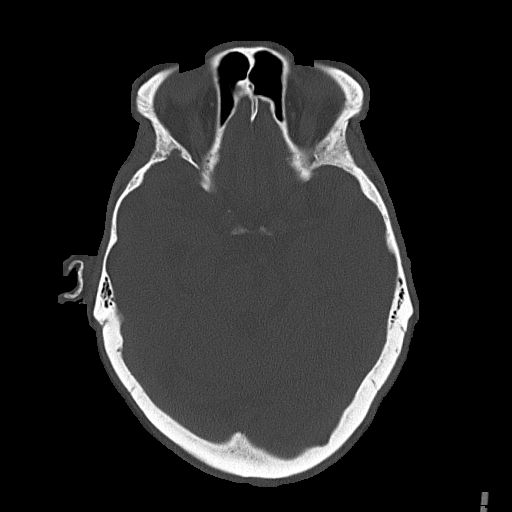
[im 39/83  bone]
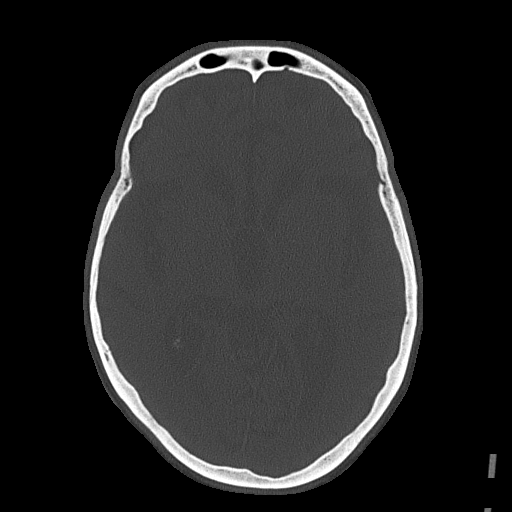
[im 44/83  bone]
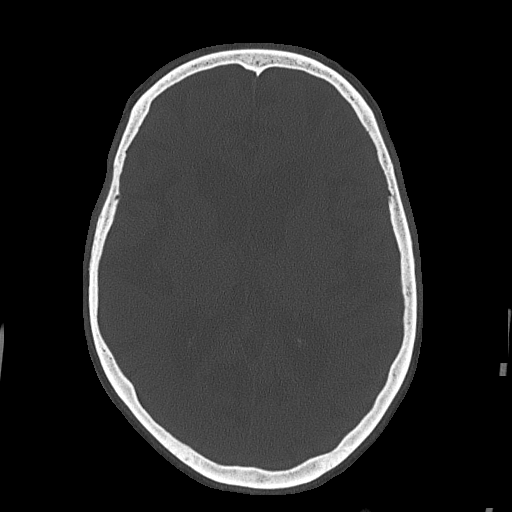
[im 55/83  bone]
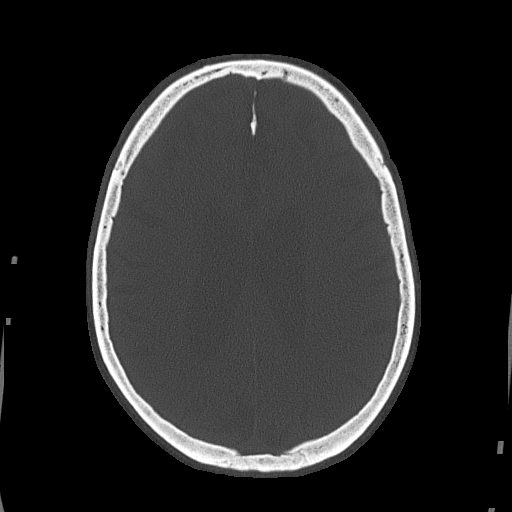
[im 66/83  bone]
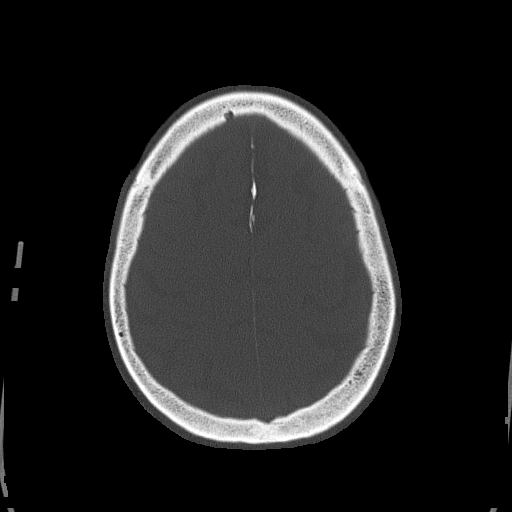

[17 of 30 positions shown; findings below may reference images not displayed]

FINDINGS: Moderate to severe age related cortical, deep, and cerebellar
atrophy. Old lacunar stroke in the pons just to the left of midline.
No mass lesion. No midline shift. No acute hemorrhage or hematoma.
No extra-axial fluid collections. No evidence of acute infarction.

Right frontoparietal scalp hematoma without underlying skull
fracture. Complete opacification of the left sphenoid sinus with
thickening of the sinus walls. Remaining visualized paranasal
sinuses, bilateral mastoid air cells, and bilateral middle ear
cavities well aerated. Bilateral carotid siphon and vertebral artery
atherosclerosis. Dystrophic calcifications involving the cartilage
of both ears.
IMPRESSION: 1. No acute intracranial abnormality.
2. Moderate to severe age related generalized atrophy.
3. Old lacunar stroke in the pons just to the left of midline.
4. Right frontoparietal scalp hematoma without underlying skull
fracture.
5. Chronic left sphenoid sinusitis.

## 2015-08-29 DEATH — deceased

## 2015-12-04 IMAGING — CR DG CHEST 1V PORT
1 series · 1 of 1 positions shown · non-contrast
Comparison: 04/23/2014.

CLINICAL DATA: 89-year-old male status post PICC line placement.
Initial encounter.

EXAM:
PORTABLE CHEST - 1 VIEW

[ap]
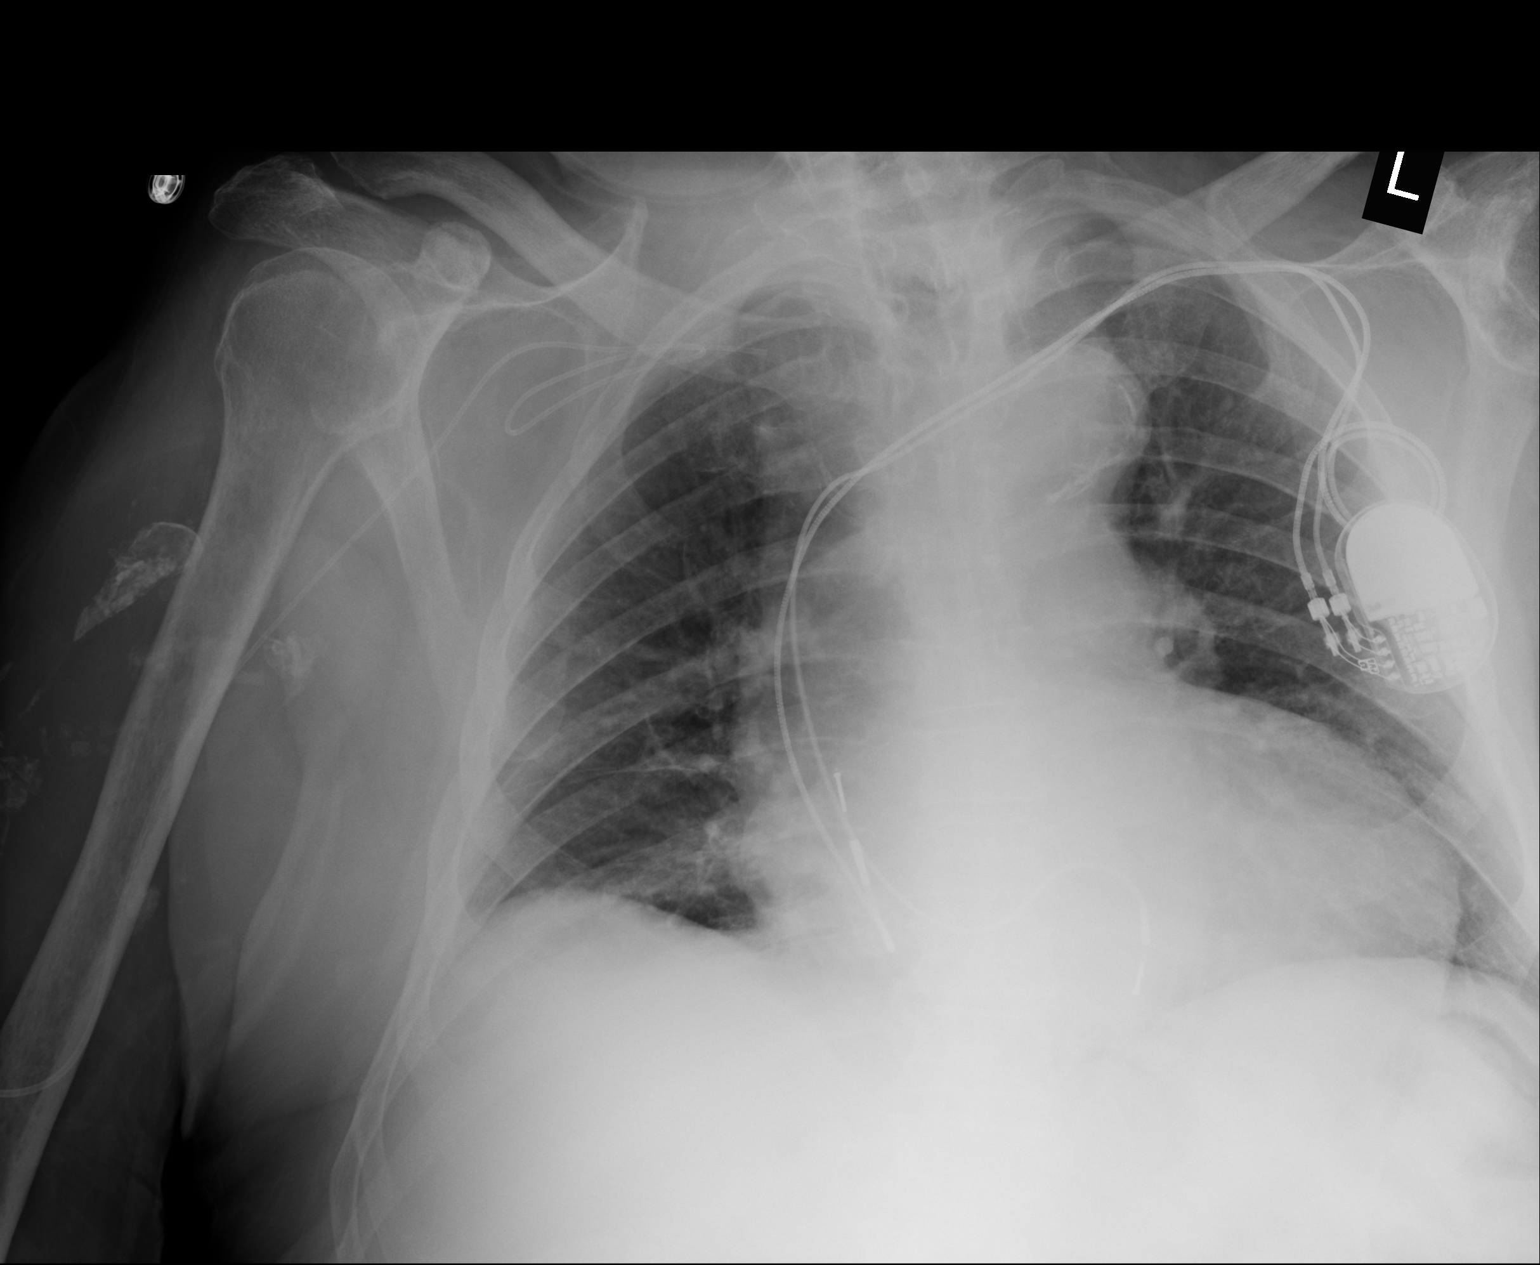

[1 of 1 positions shown; findings below may reference images not displayed]

FINDINGS: Portable AP semi upright view at 9998 hrs. Right upper extremity
approach PICC line catheter is looped at the level of the mid right
subclavian vein. This should be repositioned and/or removed and
replaced.

Dystrophic calcifications in the right upper extremity. Stable
cardiomegaly and mediastinal contours. Table ventilation.
IMPRESSION: Right upper extremity PICC line looped at the level of the right
subclavian vein, should be repositioned or removed and replaced.
